# Patient Record
Sex: Male | Born: 1960 | Race: Black or African American | Hispanic: No | Smoking: Current every day smoker
Health system: Southern US, Community
[De-identification: ages and names within clinical notes are randomized; demographics above are authoritative.]

## PROBLEM LIST (undated history)

## (undated) DIAGNOSIS — R569 Unspecified convulsions: Secondary | ICD-10-CM

## (undated) DIAGNOSIS — E119 Type 2 diabetes mellitus without complications: Secondary | ICD-10-CM

## (undated) HISTORY — PX: BACK SURGERY: SHX140

## (undated) HISTORY — PX: BRAIN SURGERY: SHX531

---

## 2020-02-20 ENCOUNTER — Encounter (HOSPITAL_COMMUNITY): Payer: Self-pay | Admitting: Emergency Medicine

## 2020-02-20 ENCOUNTER — Emergency Department (HOSPITAL_COMMUNITY): Payer: No Typology Code available for payment source

## 2020-02-20 ENCOUNTER — Emergency Department (HOSPITAL_COMMUNITY)
Admission: EM | Admit: 2020-02-20 | Discharge: 2020-02-20 | Disposition: A | Discharge status: Home / Self Care | Payer: No Typology Code available for payment source | Attending: Emergency Medicine | Admitting: Emergency Medicine

## 2020-02-20 ENCOUNTER — Other Ambulatory Visit: Payer: Self-pay

## 2020-02-20 DIAGNOSIS — E119 Type 2 diabetes mellitus without complications: Secondary | ICD-10-CM | POA: Diagnosis not present

## 2020-02-20 DIAGNOSIS — Z79899 Other long term (current) drug therapy: Secondary | ICD-10-CM | POA: Diagnosis not present

## 2020-02-20 DIAGNOSIS — Z7984 Long term (current) use of oral hypoglycemic drugs: Secondary | ICD-10-CM | POA: Diagnosis not present

## 2020-02-20 DIAGNOSIS — Y999 Unspecified external cause status: Secondary | ICD-10-CM | POA: Diagnosis not present

## 2020-02-20 DIAGNOSIS — S39012A Strain of muscle, fascia and tendon of lower back, initial encounter: Secondary | ICD-10-CM | POA: Diagnosis not present

## 2020-02-20 DIAGNOSIS — Y92410 Unspecified street and highway as the place of occurrence of the external cause: Secondary | ICD-10-CM | POA: Diagnosis not present

## 2020-02-20 DIAGNOSIS — S3992XA Unspecified injury of lower back, initial encounter: Secondary | ICD-10-CM | POA: Diagnosis present

## 2020-02-20 DIAGNOSIS — Y93I9 Activity, other involving external motion: Secondary | ICD-10-CM | POA: Insufficient documentation

## 2020-02-20 DIAGNOSIS — F17210 Nicotine dependence, cigarettes, uncomplicated: Secondary | ICD-10-CM | POA: Insufficient documentation

## 2020-02-20 HISTORY — DX: Type 2 diabetes mellitus without complications: E11.9

## 2020-02-20 HISTORY — DX: Unspecified convulsions: R56.9

## 2020-02-20 MED ORDER — HYDROCODONE-ACETAMINOPHEN 5-325 MG PO TABS: 1.0000 | ORAL_TABLET | Freq: Four times a day (QID) | ORAL | 0 refills | Status: AC | PRN

## 2020-02-20 MED ORDER — HYDROCODONE-ACETAMINOPHEN 5-325 MG PO TABS: 1.0000 | ORAL_TABLET | Freq: Four times a day (QID) | ORAL | 0 refills | Status: DC | PRN
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
## 2020-02-20 NOTE — ED Notes (Signed)
Patient given pre-pack and signed for pre-pack.

## 2020-02-20 NOTE — Discharge Instructions (Addendum)
Do not drive within 4 hours of taking hydrocodone as this will make you drowsy.  Avoid lifting,  Bending,  Twisting or any other activity that worsens your pain over the next week.  Apply an  icepack  to your lower back for 10-15 minutes every 2 hours for the next 2 days.  You should get rechecked if your symptoms are not better over the next 10 days,  Or you develop increased pain,  Weakness in your leg(s) or loss of bladder or bowel function - these can be symptoms of a worsening injury of your back.
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
## 2020-02-21 MED FILL — Hydrocodone-Acetaminophen Tab 5-325 MG: ORAL | Qty: 6 | Status: AC
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
